# Patient Record
Sex: Male | Born: 1990 | Race: White | Hispanic: No | Marital: Single | Smoking: Never smoker
Health system: Southern US, Community
[De-identification: ages and names within clinical notes are randomized; demographics above are authoritative.]

## PROBLEM LIST (undated history)

## (undated) DIAGNOSIS — K5792 Diverticulitis of intestine, part unspecified, without perforation or abscess without bleeding: Secondary | ICD-10-CM

## (undated) HISTORY — PX: HERNIA REPAIR: SHX51

---

## 2014-10-17 ENCOUNTER — Emergency Department (HOSPITAL_BASED_OUTPATIENT_CLINIC_OR_DEPARTMENT_OTHER): Payer: Self-pay

## 2014-10-17 ENCOUNTER — Emergency Department (HOSPITAL_BASED_OUTPATIENT_CLINIC_OR_DEPARTMENT_OTHER)
Admission: EM | Admit: 2014-10-17 | Discharge: 2014-10-17 | Disposition: A | Discharge status: Home / Self Care | Payer: Self-pay | Attending: Emergency Medicine | Admitting: Emergency Medicine

## 2014-10-17 ENCOUNTER — Encounter (HOSPITAL_BASED_OUTPATIENT_CLINIC_OR_DEPARTMENT_OTHER): Payer: Self-pay | Admitting: *Deleted

## 2014-10-17 DIAGNOSIS — K5732 Diverticulitis of large intestine without perforation or abscess without bleeding: Secondary | ICD-10-CM | POA: Insufficient documentation

## 2014-10-17 LAB — URINALYSIS, ROUTINE W REFLEX MICROSCOPIC
Bilirubin Urine: NEGATIVE
GLUCOSE, UA: NEGATIVE mg/dL
HGB URINE DIPSTICK: NEGATIVE
KETONES UR: NEGATIVE mg/dL
LEUKOCYTES UA: NEGATIVE
Nitrite: NEGATIVE
PH: 5.5 (ref 5.0–8.0)
Protein, ur: NEGATIVE mg/dL
Specific Gravity, Urine: >1.046 — ABNORMAL HIGH (ref 1.005–1.030)
Urobilinogen, UA: 0.2 mg/dL (ref 0.0–1.0)

## 2014-10-17 LAB — COMPREHENSIVE METABOLIC PANEL
ALT: 68 U/L — AB (ref 0–53)
AST: 41 U/L — AB (ref 0–37)
Albumin: 4.2 g/dL (ref 3.5–5.2)
Alkaline Phosphatase: 57 U/L (ref 39–117)
Anion gap: 2 — ABNORMAL LOW (ref 5–15)
BILIRUBIN TOTAL: 0.4 mg/dL (ref 0.3–1.2)
BUN: 12 mg/dL (ref 6–23)
CHLORIDE: 108 mmol/L (ref 96–112)
CO2: 27 mmol/L (ref 19–32)
Calcium: 9 mg/dL (ref 8.4–10.5)
Creatinine, Ser: 0.95 mg/dL (ref 0.50–1.35)
GFR calc Af Amer: >90 mL/min (ref >90)
GFR calc non Af Amer: >90 mL/min (ref >90)
Glucose, Bld: 91 mg/dL (ref 70–99)
Potassium: 4 mmol/L (ref 3.5–5.1)
SODIUM: 137 mmol/L (ref 135–145)
Total Protein: 7.7 g/dL (ref 6.0–8.3)

## 2014-10-17 LAB — CBC WITH DIFFERENTIAL/PLATELET
BASOS ABS: 0 10*3/uL (ref 0.0–0.1)
BASOS PCT: 0 % (ref 0–1)
EOS PCT: 1 % (ref 0–5)
Eosinophils Absolute: 0.1 10*3/uL (ref 0.0–0.7)
HCT: 44.1 % (ref 39.0–52.0)
HEMOGLOBIN: 14.7 g/dL (ref 13.0–17.0)
LYMPHS PCT: 45 % (ref 12–46)
Lymphs Abs: 3.7 10*3/uL (ref 0.7–4.0)
MCH: 28.6 pg (ref 26.0–34.0)
MCHC: 33.3 g/dL (ref 30.0–36.0)
MCV: 85.8 fL (ref 78.0–100.0)
Monocytes Absolute: 1 10*3/uL (ref 0.1–1.0)
Monocytes Relative: 12 % (ref 3–12)
Neutro Abs: 3.5 10*3/uL (ref 1.7–7.7)
Neutrophils Relative %: 42 % — ABNORMAL LOW (ref 43–77)
PLATELETS: 233 10*3/uL (ref 150–400)
RBC: 5.14 MIL/uL (ref 4.22–5.81)
RDW: 13.3 % (ref 11.5–15.5)
WBC: 8.2 10*3/uL (ref 4.0–10.5)

## 2014-10-17 LAB — LIPASE, BLOOD: Lipase: 24 U/L (ref 11–59)

## 2014-10-17 MED ORDER — METRONIDAZOLE 500 MG PO TABS: 500.0000 mg | ORAL_TABLET | Freq: Two times a day (BID) | ORAL | Status: DC

## 2014-10-17 MED ORDER — IOHEXOL 300 MG/ML  SOLN
100.0000 mL | Freq: Once | INTRAMUSCULAR | Status: AC | PRN
  Administered 2014-10-17: 100 mL via INTRAVENOUS

## 2014-10-17 MED ORDER — CIPROFLOXACIN HCL 500 MG PO TABS
500.0000 mg | ORAL_TABLET | Freq: Once | ORAL | Status: AC
  Administered 2014-10-17: 500 mg via ORAL
  Filled 2014-10-17: qty 1

## 2014-10-17 MED ORDER — METRONIDAZOLE 500 MG PO TABS
500.0000 mg | ORAL_TABLET | Freq: Once | ORAL | Status: AC
  Administered 2014-10-17: 500 mg via ORAL
  Filled 2014-10-17: qty 1

## 2014-10-17 MED ORDER — CIPROFLOXACIN HCL 500 MG PO TABS: 500.0000 mg | ORAL_TABLET | Freq: Two times a day (BID) | ORAL | Status: DC

## 2014-10-17 MED ORDER — HYDROCODONE-ACETAMINOPHEN 5-325 MG PO TABS: 1.0000 | ORAL_TABLET | Freq: Four times a day (QID) | ORAL | Status: DC | PRN

## 2014-10-17 MED ORDER — IOHEXOL 300 MG/ML  SOLN
25.0000 mL | Freq: Once | INTRAMUSCULAR | Status: AC | PRN
  Administered 2014-10-17: 25 mL via ORAL

## 2014-10-17 NOTE — Discharge Instructions (Signed)
Diverticulitis °Diverticulitis is inflammation or infection of small pouches in your colon that form when you have a condition called diverticulosis. The pouches in your colon are called diverticula. Your colon, or large intestine, is where water is absorbed and stool is formed. °Complications of diverticulitis can include: °· Bleeding. °· Severe infection. °· Severe pain. °· Perforation of your colon. °· Obstruction of your colon. °CAUSES  °Diverticulitis is caused by bacteria. °Diverticulitis happens when stool becomes trapped in diverticula. This allows bacteria to grow in the diverticula, which can lead to inflammation and infection. °RISK FACTORS °People with diverticulosis are at risk for diverticulitis. Eating a diet that does not include enough fiber from fruits and vegetables may make diverticulitis more likely to develop. °SYMPTOMS  °Symptoms of diverticulitis may include: °· Abdominal pain and tenderness. The pain is normally located on the left side of the abdomen, but may occur in other areas. °· Fever and chills. °· Bloating. °· Cramping. °· Nausea. °· Vomiting. °· Constipation. °· Diarrhea. °· Blood in your stool. °DIAGNOSIS  °Your health care provider will ask you about your medical history and do a physical exam. You may need to have tests done because many medical conditions can cause the same symptoms as diverticulitis. Tests may include: °· Blood tests. °· Urine tests. °· Imaging tests of the abdomen, including X-rays and CT scans. °When your condition is under control, your health care provider may recommend that you have a colonoscopy. A colonoscopy can show how severe your diverticula are and whether something else is causing your symptoms. °TREATMENT  °Most cases of diverticulitis are mild and can be treated at home. Treatment may include: °· Taking over-the-counter pain medicines. °· Following a clear liquid diet. °· Taking antibiotic medicines by mouth for 7-10 days. °More severe cases may  be treated at a hospital. Treatment may include: °· Not eating or drinking. °· Taking prescription pain medicine. °· Receiving antibiotic medicines through an IV tube. °· Receiving fluids and nutrition through an IV tube. °· Surgery. °HOME CARE INSTRUCTIONS  °· Follow your health care provider's instructions carefully. °· Follow a full liquid diet or other diet as directed by your health care provider. After your symptoms improve, your health care provider may tell you to change your diet. He or she may recommend you eat a high-fiber diet. Fruits and vegetables are good sources of fiber. Fiber makes it easier to pass stool. °· Take fiber supplements or probiotics as directed by your health care provider. °· Only take medicines as directed by your health care provider. °· Keep all your follow-up appointments. °SEEK MEDICAL CARE IF:  °· Your pain does not improve. °· You have a hard time eating food. °· Your bowel movements do not return to normal. °SEEK IMMEDIATE MEDICAL CARE IF:  °· Your pain becomes worse. °· Your symptoms do not get better. °· Your symptoms suddenly get worse. °· You have a fever. °· You have repeated vomiting. °· You have bloody or black, tarry stools. °MAKE SURE YOU:  °· Understand these instructions. °· Will watch your condition. °· Will get help right away if you are not doing well or get worse. °Document Released: 05/13/2005 Document Revised: 08/08/2013 Document Reviewed: 06/28/2013 °[employer]® Patient Information ©2015 [employer]. This information is not intended to replace advice given to you by your health care provider. Make sure you discuss any questions you have with your health care provider. ° ° °High-Fiber Diet °Fiber is found in fruits, vegetables, and grains. A high-fiber   diet encourages the addition of more whole grains, legumes, fruits, and vegetables in your diet. The recommended amount of fiber for adult males is 38 g per day. For adult females, it is 25 g per day. Pregnant  and lactating women should get 28 g of fiber per day. If you have a digestive or bowel problem, ask your caregiver for advice before adding high-fiber foods to your diet. Eat a variety of high-fiber foods instead of only a select few type of foods.  °PURPOSE °· To increase stool bulk. °· To make bowel movements more regular to prevent constipation. °· To lower cholesterol. °· To prevent overeating. °WHEN IS THIS DIET USED? °· It may be used if you have constipation and hemorrhoids. °· It may be used if you have uncomplicated diverticulosis (intestine condition) and irritable bowel syndrome. °· It may be used if you need help with weight management. °· It may be used if you want to add it to your diet as a protective measure against atherosclerosis, diabetes, and cancer. °SOURCES OF FIBER °· Whole-grain breads and cereals. °· Fruits, such as apples, oranges, bananas, berries, prunes, and pears. °· Vegetables, such as green peas, carrots, sweet potatoes, beets, broccoli, cabbage, spinach, and artichokes. °· Legumes, such split peas, soy, lentils. °· Almonds. °FIBER CONTENT IN FOODS °Starches and Grains / Dietary Fiber (g) °· Cheerios, 1 cup / 3 g °· Corn Flakes cereal, 1 cup / 0.7 g °· Rice crispy treat cereal, 1¼ cup / 0.3 g °· Instant oatmeal (cooked), ½ cup / 2 g °· Frosted wheat cereal, 1 cup / 5.1 g °· Brown, long-grain rice (cooked), 1 cup / 3.5 g °· White, long-grain rice (cooked), 1 cup / 0.6 g °· Enriched macaroni (cooked), 1 cup / 2.5 g °Legumes / Dietary Fiber (g) °· Baked beans (canned, plain, or vegetarian), ½ cup / 5.2 g °· Kidney beans (canned), ½ cup / 6.8 g °· Pinto beans (cooked), ½ cup / 5.5 g °Breads and Crackers / Dietary Fiber (g) °· Plain or honey graham crackers, 2 squares / 0.7 g °· Saltine crackers, 3 squares / 0.3 g °· Plain, salted pretzels, 10 pieces / 1.8 g °· Whole-wheat bread, 1 slice / 1.9 g °· White bread, 1 slice / 0.7 g °· Raisin bread, 1 slice / 1.2 g °· Plain bagel, 3 oz / 2  g °· Flour tortilla, 1 oz / 0.9 g °· Corn tortilla, 1 small / 1.5 g °· Hamburger or hotdog bun, 1 small / 0.9 g °Fruits / Dietary Fiber (g) °· Apple with skin, 1 medium / 4.4 g °· Sweetened applesauce, ½ cup / 1.5 g °· Banana, ½ medium / 1.5 g °· Grapes, 10 grapes / 0.4 g °· Orange, 1 small / 2.3 g °· Raisin, 1.5 oz / 1.6 g °· Melon, 1 cup / 1.4 g °Vegetables / Dietary Fiber (g) °· Green beans (canned), ½ cup / 1.3 g °· Carrots (cooked), ½ cup / 2.3 g °· Broccoli (cooked), ½ cup / 2.8 g °· Peas (cooked), ½ cup / 4.4 g °· Mashed potatoes, ½ cup / 1.6 g °· Lettuce, 1 cup / 0.5 g °· Corn (canned), ½ cup / 1.6 g °· Tomato, ½ cup / 1.1 g °Document Released: 08/03/2005 Document Revised: 02/02/2012 Document Reviewed: 11/05/2011 °Exit[employer]® Patient Information ©2015 [employer]. This information is not intended to replace advice given to you by your health care provider. Make sure you discuss any questions you have with your health   care provider. ° °

## 2014-10-17 NOTE — ED Provider Notes (Signed)
CSN: [id number]     Arrival date & time 10/17/14  1641 History   First MD Initiated Contact with Patient 10/17/14 1651     Chief Complaint  Patient presents with  . Abdominal Pain     (Consider location/radiation/quality/duration/timing/severity/associated sxs/prior Treatment) HPI  24 year old male presents with left lower quadrant abdominal pain for the past 2 days. It is a sharp pain that is constant. Nothing particularly makes it better or worse including food and bowel movements. He thought he was constipated and took some miralax but that did not change anything. No dysuria or hematuria. No groin swelling or testicular pain or swelling. No penile discharge. Has occasionally felt nauseated but no vomiting. No prior history of similar symptoms. Rates his pain as a 7/10. Has not taken anything for the pain.  History reviewed. No pertinent past medical history. Past Surgical History  Procedure Laterality Date  . Hernia repair     No family history on file. History  Substance Use Topics  . Smoking status: Never Smoker   . Smokeless tobacco: Not on file  . Alcohol Use: No    Review of Systems  Constitutional: Negative for fever.  Gastrointestinal: Positive for abdominal pain. Negative for vomiting, diarrhea and constipation.  Genitourinary: Negative for dysuria, hematuria, discharge, scrotal swelling, penile pain and testicular pain.       Allergies  Review of patient's allergies indicates not on file.  Home Medications   Prior to Admission medications   Not on File   BP 154/77 mmHg  Pulse 88  Temp(Src) 98.4 F (36.9 C) (Oral)  Resp 18  Ht 6' (1.829 m)  Wt 235 lb (106.595 kg)  BMI 31.86 kg/m2  SpO2 100% Physical Exam  Constitutional: He is oriented to person, place, and time. He appears well-developed and well-nourished.  HENT:  Head: Normocephalic and atraumatic.  Right Ear: External ear normal.  Left Ear: External ear normal.  Nose: Nose normal.  Eyes: Right  eye exhibits no discharge. Left eye exhibits no discharge.  Neck: Neck supple.  Cardiovascular: Normal rate, regular rhythm, normal heart sounds and intact distal pulses.   Pulmonary/Chest: Effort normal.  Abdominal: Soft. There is tenderness in the right lower quadrant, suprapubic area and left lower quadrant. Hernia confirmed negative in the right inguinal area and confirmed negative in the left inguinal area.  Genitourinary: Penis normal.  Musculoskeletal: He exhibits no edema.  Neurological: He is alert and oriented to person, place, and time.  Skin: Skin is warm and dry.  Nursing note and vitals reviewed.   ED Course  Procedures (including critical care time) Labs Review Labs Reviewed  COMPREHENSIVE METABOLIC PANEL - Abnormal; Notable for the following:    AST 41 (*)    ALT 68 (*)    Anion gap 2 (*)    All other components within normal limits  CBC WITH DIFFERENTIAL/PLATELET - Abnormal; Notable for the following:    Neutrophils Relative % 42 (*)    All other components within normal limits  URINALYSIS, ROUTINE W REFLEX MICROSCOPIC - Abnormal; Notable for the following:    Specific Gravity, Urine >1.046 (*)    All other components within normal limits  LIPASE, BLOOD    Imaging Review Ct Abdomen Pelvis W Contrast  10/17/2014   CLINICAL DATA:  Left lower quadrant abdominal pain and nausea.  EXAM: CT ABDOMEN AND PELVIS WITH CONTRAST  TECHNIQUE: Multidetector CT imaging of the abdomen and pelvis was performed using the standard protocol following bolus administration of intravenous  contrast.  CONTRAST:  25mL OMNIPAQUE IOHEXOL 300 MG/ML SOLN, OMNIPAQUE IOHEXOL 300 MG/ML SOLN  COMPARISON:  None.  FINDINGS: Visualized lung bases are normal. The liver shows diffuse steatosis without evidence of overt cirrhosis. No hepatic masses or biliary dilatation identified.  The gallbladder, pancreas, spleen, adrenal glands and kidneys are within normal limits.  There is evidence of acute  diverticulitis involving the mid sigmoid colon with wall thickening and surrounding pericolonic inflammatory changes present. Overall inflammatory changes are relatively mild. There is no evidence of extraluminal air or focal abscess.  No evidence of bowel obstruction. No masses or enlarged lymph nodes are seen. No hernias are identified. The bladder is unremarkable. No abnormal calcifications or vascular abnormalities. Bony structures are within normal limits.  IMPRESSION: 1. Acute diverticulitis of the sigmoid colon. Inflammatory changes are relatively mild and there is no evidence of extraluminal air or focal abscess. 2. Diffuse steatosis of the liver.   Electronically Signed   By: Irish Lack M.D.   On: 10/17/2014 18:44     EKG Interpretation None      MDM   Final diagnoses:  Diverticulitis of large intestine without perforation or abscess without bleeding    Patient CT scan shows uncomplicated acute diverticulitis. No abscess or perforation. Patient's pain is moderate and he declines pain meds currently. Given his young and otherwise healthy he will be treated outpatient with Cipro and Flagyl. We'll give narcotics for pain control as needed. Discussed importance of follow-up and establishment of a PCP as well as strict return precautions.    Audree Camel, MD 10/17/14 [phone]

## 2014-10-17 NOTE — ED Notes (Signed)
Pt reports sharp lower left abdominal pain and nausea for two days.

## 2014-10-17 NOTE — ED Notes (Signed)
Pt comes in today with a c/o left lower quad/inguinal area pain that has been ongoing for the past 2 days. Pt denies any trauma.  Pt states he has been having nausea. Pt states "it feels like I'm going to throw up but I have swallowed it." Pt denies any vomiting or diarrhea.

## 2016-02-17 ENCOUNTER — Emergency Department (HOSPITAL_BASED_OUTPATIENT_CLINIC_OR_DEPARTMENT_OTHER): Payer: Self-pay

## 2016-02-17 ENCOUNTER — Emergency Department (HOSPITAL_BASED_OUTPATIENT_CLINIC_OR_DEPARTMENT_OTHER)
Admission: EM | Admit: 2016-02-17 | Discharge: 2016-02-17 | Disposition: A | Discharge status: Home / Self Care | Payer: Self-pay | Attending: Emergency Medicine | Admitting: Emergency Medicine

## 2016-02-17 ENCOUNTER — Encounter (HOSPITAL_BASED_OUTPATIENT_CLINIC_OR_DEPARTMENT_OTHER): Payer: Self-pay | Admitting: *Deleted

## 2016-02-17 DIAGNOSIS — K5732 Diverticulitis of large intestine without perforation or abscess without bleeding: Secondary | ICD-10-CM | POA: Insufficient documentation

## 2016-02-17 HISTORY — DX: Diverticulitis of intestine, part unspecified, without perforation or abscess without bleeding: K57.92

## 2016-02-17 LAB — COMPREHENSIVE METABOLIC PANEL
ALBUMIN: 4.3 g/dL (ref 3.5–5.0)
ALK PHOS: 60 U/L (ref 38–126)
ALT: 32 U/L (ref 17–63)
AST: 27 U/L (ref 15–41)
Anion gap: 8 (ref 5–15)
BUN: 12 mg/dL (ref 6–20)
CALCIUM: 9.3 mg/dL (ref 8.9–10.3)
CO2: 28 mmol/L (ref 22–32)
CREATININE: 0.94 mg/dL (ref 0.61–1.24)
Chloride: 105 mmol/L (ref 101–111)
GFR calc non Af Amer: >60 mL/min (ref >60)
Glucose, Bld: 89 mg/dL (ref 65–99)
Potassium: 4 mmol/L (ref 3.5–5.1)
Sodium: 141 mmol/L (ref 135–145)
Total Bilirubin: 0.5 mg/dL (ref 0.3–1.2)
Total Protein: 8 g/dL (ref 6.5–8.1)

## 2016-02-17 LAB — CBC WITH DIFFERENTIAL/PLATELET
Basophils Absolute: 0 10*3/uL (ref 0.0–0.1)
Basophils Relative: 0 %
EOS ABS: 0.1 10*3/uL (ref 0.0–0.7)
Eosinophils Relative: 1 %
HCT: 45.9 % (ref 39.0–52.0)
HEMOGLOBIN: 15.6 g/dL (ref 13.0–17.0)
Lymphocytes Relative: 39 %
Lymphs Abs: 3.2 10*3/uL (ref 0.7–4.0)
MCH: 28.7 pg (ref 26.0–34.0)
MCHC: 34 g/dL (ref 30.0–36.0)
MCV: 84.4 fL (ref 78.0–100.0)
Monocytes Absolute: 0.9 10*3/uL (ref 0.1–1.0)
Monocytes Relative: 11 %
Neutro Abs: 4.1 10*3/uL (ref 1.7–7.7)
Neutrophils Relative %: 49 %
Platelets: 256 10*3/uL (ref 150–400)
RBC: 5.44 MIL/uL (ref 4.22–5.81)
RDW: 13.3 % (ref 11.5–15.5)
WBC: 8.3 10*3/uL (ref 4.0–10.5)

## 2016-02-17 LAB — URINALYSIS, ROUTINE W REFLEX MICROSCOPIC
BILIRUBIN URINE: NEGATIVE
Glucose, UA: NEGATIVE mg/dL
Hgb urine dipstick: NEGATIVE
Ketones, ur: NEGATIVE mg/dL
Leukocytes, UA: NEGATIVE
NITRITE: NEGATIVE
Protein, ur: NEGATIVE mg/dL
Specific Gravity, Urine: 1.018 (ref 1.005–1.030)
pH: 5.5 (ref 5.0–8.0)

## 2016-02-17 MED ORDER — METRONIDAZOLE 500 MG PO TABS: 500.0000 mg | ORAL_TABLET | Freq: Three times a day (TID) | ORAL | Status: AC

## 2016-02-17 MED ORDER — METRONIDAZOLE 500 MG PO TABS
500.0000 mg | ORAL_TABLET | Freq: Once | ORAL | Status: AC
  Administered 2016-02-17: 500 mg via ORAL
  Filled 2016-02-17: qty 1

## 2016-02-17 MED ORDER — CIPROFLOXACIN HCL 500 MG PO TABS
500.0000 mg | ORAL_TABLET | Freq: Once | ORAL | Status: AC
  Administered 2016-02-17: 500 mg via ORAL
  Filled 2016-02-17: qty 1

## 2016-02-17 MED ORDER — HYDROCODONE-ACETAMINOPHEN 5-325 MG PO TABS: ORAL_TABLET | ORAL | Status: AC

## 2016-02-17 MED ORDER — CIPROFLOXACIN HCL 500 MG PO TABS: 500.0000 mg | ORAL_TABLET | Freq: Two times a day (BID) | ORAL | Status: AC

## 2016-02-17 MED ORDER — IOPAMIDOL (ISOVUE-300) INJECTION 61%
100.0000 mL | Freq: Once | INTRAVENOUS | Status: AC | PRN
  Administered 2016-02-17: 100 mL via INTRAVENOUS

## 2016-02-17 NOTE — ED Notes (Signed)
Patient transported to CT 

## 2016-02-17 NOTE — ED Notes (Signed)
Pain below his umbilicus for a week.  He thinks he is a diverticulitis flare up.

## 2016-02-17 NOTE — ED Provider Notes (Signed)
CSN: [id number]     Arrival date & time 02/17/16  1628 History  By signing my name below, I, Benjamin Glover, attest that this documentation has been prepared under the direction and in the presence of Renne CriglerJoshua Torres Hardenbrook, PA-C.   Electronically Signed: Rosario AdieWilliam Andrew Glover, ED Scribe. 02/17/2016. 6:34 PM.   Chief Complaint  Patient presents with  . Abdominal Pain   HPI HPI Comments: Benjamin Glover is a 25 y.o. male with a PMHx significant for sigmoid diverticulitis who presents to the Emergency Department complaining of gradual onset, gradually worsening, constant, 8/10 right-sided and umbilical abdominal pain onset four days PTA. Pt has a hx of abdominal pain with his previous dx of diverticulitis approximately 1 year ago, and notes that his pain today is not in the same area as his last flare up. He has taken Miralax, Advil, and left over antibiotics from his last flare up 1 year ago prior to coming into the ED with no relief of his pain. No aggravating factors. Pt reports that he has had a central abdominal hernia repair approximately 16 years PTA. Pt denies constipation, urinary problems, or fever.   Past Medical History  Diagnosis Date  . Diverticulitis    Past Surgical History  Procedure Laterality Date  . Hernia repair     No family history on file. Social History  Substance Use Topics  . Smoking status: Never Smoker   . Smokeless tobacco: None  . Alcohol Use: No    Review of Systems  Constitutional: Negative for fever.  HENT: Negative for rhinorrhea and sore throat.   Eyes: Negative for redness.  Respiratory: Negative for cough.   Cardiovascular: Negative for chest pain.  Gastrointestinal: Positive for abdominal pain. Negative for nausea, vomiting, diarrhea and constipation.  Genitourinary: Negative for dysuria, urgency, frequency, hematuria and difficulty urinating.  Musculoskeletal: Negative for myalgias.  Skin: Negative for rash.  Neurological: Negative for headaches.    Allergies  Review of patient's allergies indicates no known allergies.  Home Medications   Prior to Admission medications   Medication Sig Start Date End Date Taking? Authorizing Provider  ciprofloxacin (CIPRO) 500 MG tablet Take 1 tablet (500 mg total) by mouth 2 (two) times daily. One po bid x 7 days 10/17/14   Pricilla LovelessScott Goldston, MD  HYDROcodone-acetaminophen [facility](NORCO) 5-325 MG per tablet Take 1 tablet by mouth every 6 (six) hours as needed for severe pain. 10/17/14   Pricilla LovelessScott Goldston, MD  metroNIDAZOLE (FLAGYL) 500 MG tablet Take 1 tablet (500 mg total) by mouth 2 (two) times daily. One po bid x 7 days 10/17/14   Pricilla LovelessScott Goldston, MD   BP 151/89 mmHg  Pulse 103  Temp(Src) 98.7 F (37.1 C) (Oral)  Resp 18  Ht 6' (1.829 m)  Wt 275 lb (124.739 kg)  BMI 37.29 kg/m2  SpO2 100%   Physical Exam  Constitutional: He appears well-developed and well-nourished.  HENT:  Head: Normocephalic and atraumatic.  Eyes: Conjunctivae are normal. Right eye exhibits no discharge. Left eye exhibits no discharge.  Neck: Normal range of motion. Neck supple.  Cardiovascular: Normal rate, regular rhythm and normal heart sounds.   Pulmonary/Chest: Effort normal and breath sounds normal. No respiratory distress.  Abdominal: Soft. He exhibits no distension. There is tenderness in the right lower quadrant and suprapubic area. There is no rebound, no guarding and no CVA tenderness.    Musculoskeletal: Normal range of motion.  Neurological: He is alert.  Skin: Skin is warm and dry.  Psychiatric: He has  a normal mood and affect. His behavior is normal.  Nursing note and vitals reviewed.  ED Course  Procedures (including critical care time)  DIAGNOSTIC STUDIES: Oxygen Saturation is 100% on RA, normal by my interpretation.   COORDINATION OF CARE: 6:33 PM-Discussed next steps with pt including CT A/p w/ contrast, CBC, CMP, and UA. Pt verbalized understanding and is agreeable with the plan.   Labs Review Labs  Reviewed  URINALYSIS, ROUTINE W REFLEX MICROSCOPIC (NOT AT [facility])  CBC WITH DIFFERENTIAL/PLATELET  COMPREHENSIVE METABOLIC PANEL   Imaging Review Ct Abdomen Pelvis W Contrast  02/17/2016  CLINICAL DATA:  One week history of right lower quadrant pain EXAM: CT ABDOMEN AND PELVIS WITH CONTRAST TECHNIQUE: Multidetector CT imaging of the abdomen and pelvis was performed using the standard protocol following bolus administration of intravenous contrast. Oral contrast was also administered. CONTRAST:  ISOVUE-300 IOPAMIDOL (ISOVUE-300) INJECTION 61% COMPARISON:  October 17, 2014 FINDINGS: Lower chest: There is a small calcified granuloma in the superior segment of the right lower lobe. The lung bases otherwise are clear. Hepatobiliary: No focal liver lesions are evident. Gallbladder wall is not appreciably thickened. There is no biliary duct dilatation. Pancreas: There is no appreciable pancreatic mass or inflammatory focus. Spleen: No splenic lesions are evident. Small splenules are noted adjacent to the spleen. Adrenals/Urinary Tract: Adrenals appear normal bilaterally. Kidneys bilaterally show no mass or hydronephrosis on either side. There is no demonstrable renal or ureteral calculus on either side. Urinary bladder is midline with wall thickness within normal limits. Stomach/Bowel: There is mesenteric thickening in the region of the distal sigmoid colon with mild wall thickening in this area consistent with a degree of distal sigmoid diverticulitis. There is no abscess or perforation in this area. No other bowel wall or mesenteric thickening is evident on this study. There are scattered diverticula throughout the remainder the colon without inflammation. There is no bowel obstruction. No free air or portal venous air. Vascular/Lymphatic: There is no abdominal aortic aneurysm. Mesenteric vessels appear patent. There is no adenopathy in the abdomen or pelvis. Reproductive: Prostate and seminal vesicles appear  normal. There is no pelvic mass or pelvic fluid collection. Other: Appendix appears normal. There is no ascites or abscess in the abdomen or pelvis. There is a small ventral hernia containing only fat. Musculoskeletal: There are no blastic or lytic bone lesions. There is no intramuscular or abdominal wall lesion. IMPRESSION: Distal sigmoid diverticulitis without abscess or perforation. Bowel elsewhere appears unremarkable except for scattered colonic diverticula elsewhere without inflammation. No bowel obstruction. No abscess elsewhere. Appendix appears normal. No renal or ureteral calculus.  No hydronephrosis. Small ventral hernia containing only fat. Electronically Signed   By: Bretta Bang III M.D.   On: 02/17/2016 20:46   I have personally reviewed and evaluated these images and lab results as part of my medical decision-making.  9:14 PM patient updated on imaging results. Patient given Cipro/Flagyl in emergency department. Discharged home with treatment. Encouraged PCP follow-up in 5 days for recheck.  The patient was urged to return to the Emergency Department immediately with worsening of current symptoms, worsening abdominal pain, persistent vomiting, blood noted in stools, fever, or any other concerns. The patient verbalized understanding.   Patient counseled on use of narcotic pain medications. Counseled not to combine these medications with others containing tylenol. Urged not to drink alcohol, drive, or perform any other activities that requires focus while taking these medications. The patient verbalizes understanding and agrees with the plan.  MDM   Final diagnoses:  Sigmoid diverticulitis   Patient with right lower quadrant abdominal pain, CT scans demonstrates recurrent sigmoid diverticulitis without complication. Vitals are stable, no fever. Labs reassuring. No signs of dehydration, patient is tolerating PO's. Lungs are clear and no signs suggestive of PNA. Encouraged PCP/GI  follow-up as patient is fairly young for diverticulitis. May benefit from specialist management.   I personally performed the services described in this documentation, which was scribed in my presence. The recorded information has been reviewed and is accurate.     Renne CriglerJoshua Lakechia Nay, PA-C 02/17/16 2116  Gwyneth SproutWhitney Plunkett, MD 02/17/16 [phone]

## 2016-02-17 NOTE — Discharge Instructions (Signed)
Please read and follow all provided instructions.  Your diagnoses today include:  1. Sigmoid diverticulitis     Tests performed today include:  Blood counts and electrolytes  Blood tests to check liver and kidney function  Blood tests to check pancreas function  Urine test to look for infection and pregnancy (in women)  CT abdomen and pelvis - shows diverticulitis  Vital signs. See below for your results today.   Medications prescribed:   Ciprofloxacin - antibiotic  You have been prescribed an antibiotic medicine: take the entire course of medicine even if you are feeling better. Stopping early can cause the antibiotic not to work.   Metronidazole - antibiotic  You have been prescribed an antibiotic medicine: take the entire course of medicine even if you are feeling better. Stopping early can cause the antibiotic not to work. Do not drink alcohol when taking this medication.    Vicodin (hydrocodone/acetaminophen) - narcotic pain medication  DO NOT drive or perform any activities that require you to be awake and alert because this medicine can make you drowsy. BE VERY CAREFUL not to take multiple medicines containing Tylenol (also called acetaminophen). Doing so can lead to an overdose which can damage your liver and cause liver failure and possibly death.  Take any prescribed medications only as directed.  Home care instructions:   Follow any educational materials contained in this packet.  Follow-up instructions: Please follow-up with your primary care provider in the next 5 days for further evaluation of your symptoms.    Return instructions:  SEEK IMMEDIATE MEDICAL ATTENTION IF:  The pain does not go away or becomes severe   A temperature above 101F develops   Repeated vomiting occurs (multiple episodes)   The pain becomes localized to portions of the abdomen. The right side could possibly be appendicitis. In an adult, the left lower portion of the abdomen  could be colitis or diverticulitis.   Blood is being passed in stools or vomit (bright red or black tarry stools)   You develop chest pain, difficulty breathing, dizziness or fainting, or become confused, poorly responsive, or inconsolable (young children)  If you have any other emergent concerns regarding your health  Additional Information: Abdominal (belly) pain can be caused by many things. Your caregiver performed an examination and possibly ordered blood/urine tests and imaging (CT scan, x-rays, ultrasound). Many cases can be observed and treated at home after initial evaluation in the emergency department. Even though you are being discharged home, abdominal pain can be unpredictable. Therefore, you need a repeated exam if your pain does not resolve, returns, or worsens. Most patients with abdominal pain don't have to be admitted to the hospital or have surgery, but serious problems like appendicitis and gallbladder attacks can start out as nonspecific pain. Many abdominal conditions cannot be diagnosed in one visit, so follow-up evaluations are very important.  Your vital signs today were: BP 151/89 mmHg   Pulse 103   Temp(Src) 98.7 F (37.1 C) (Oral)   Resp 18   Ht 6' (1.829 m)   Wt 124.739 kg   BMI 37.29 kg/m2   SpO2 100% If your blood pressure (bp) was elevated above 135/85 this visit, please have this repeated by your doctor within one month. --------------

## 2020-11-25 ENCOUNTER — Emergency Department (HOSPITAL_COMMUNITY): Payer: No Typology Code available for payment source

## 2020-11-25 ENCOUNTER — Encounter (HOSPITAL_COMMUNITY): Payer: Self-pay | Admitting: Emergency Medicine

## 2020-11-25 ENCOUNTER — Other Ambulatory Visit: Payer: Self-pay

## 2020-11-25 ENCOUNTER — Emergency Department (HOSPITAL_COMMUNITY)
Admission: EM | Admit: 2020-11-25 | Discharge: 2020-11-25 | Disposition: A | Discharge status: Home / Self Care | Payer: No Typology Code available for payment source | Attending: Emergency Medicine | Admitting: Emergency Medicine

## 2020-11-25 DIAGNOSIS — R1032 Left lower quadrant pain: Secondary | ICD-10-CM | POA: Diagnosis not present

## 2020-11-25 DIAGNOSIS — R1031 Right lower quadrant pain: Secondary | ICD-10-CM | POA: Diagnosis not present

## 2020-11-25 LAB — URINALYSIS, ROUTINE W REFLEX MICROSCOPIC
Bilirubin Urine: NEGATIVE
Glucose, UA: NEGATIVE mg/dL
Hgb urine dipstick: NEGATIVE
Ketones, ur: NEGATIVE mg/dL
Leukocytes,Ua: NEGATIVE
Nitrite: NEGATIVE
Protein, ur: NEGATIVE mg/dL
Specific Gravity, Urine: 1.024 (ref 1.005–1.030)
pH: 7 (ref 5.0–8.0)

## 2020-11-25 LAB — CBC WITH DIFFERENTIAL/PLATELET
Abs Immature Granulocytes: 0.02 10*3/uL (ref 0.00–0.07)
Basophils Absolute: 0 10*3/uL (ref 0.0–0.1)
Basophils Relative: 1 %
Eosinophils Absolute: 0.1 10*3/uL (ref 0.0–0.5)
Eosinophils Relative: 1 %
HCT: 49.3 % (ref 39.0–52.0)
Hemoglobin: 16.2 g/dL (ref 13.0–17.0)
Immature Granulocytes: 0 %
Lymphocytes Relative: 40 %
Lymphs Abs: 2.1 10*3/uL (ref 0.7–4.0)
MCH: 29 pg (ref 26.0–34.0)
MCHC: 32.9 g/dL (ref 30.0–36.0)
MCV: 88.4 fL (ref 80.0–100.0)
Monocytes Absolute: 0.6 10*3/uL (ref 0.1–1.0)
Monocytes Relative: 11 %
Neutro Abs: 2.4 10*3/uL (ref 1.7–7.7)
Neutrophils Relative %: 47 %
Platelets: 230 10*3/uL (ref 150–400)
RBC: 5.58 MIL/uL (ref 4.22–5.81)
RDW: 12.3 % (ref 11.5–15.5)
WBC: 5.2 10*3/uL (ref 4.0–10.5)
nRBC: 0 % (ref 0.0–0.2)

## 2020-11-25 LAB — COMPREHENSIVE METABOLIC PANEL
ALT: 101 U/L — ABNORMAL HIGH (ref 0–44)
AST: 67 U/L — ABNORMAL HIGH (ref 15–41)
Albumin: 4.2 g/dL (ref 3.5–5.0)
Alkaline Phosphatase: 51 U/L (ref 38–126)
Anion gap: 10 (ref 5–15)
BUN: 10 mg/dL (ref 6–20)
CO2: 23 mmol/L (ref 22–32)
Calcium: 9 mg/dL (ref 8.9–10.3)
Chloride: 105 mmol/L (ref 98–111)
Creatinine, Ser: 0.8 mg/dL (ref 0.61–1.24)
GFR, Estimated: >60 mL/min (ref >60)
Glucose, Bld: 82 mg/dL (ref 70–99)
Potassium: 4 mmol/L (ref 3.5–5.1)
Sodium: 138 mmol/L (ref 135–145)
Total Bilirubin: 0.7 mg/dL (ref 0.3–1.2)
Total Protein: 7.7 g/dL (ref 6.5–8.1)

## 2020-11-25 LAB — LIPASE, BLOOD: Lipase: 26 U/L (ref 11–51)

## 2020-11-25 MED ORDER — IOHEXOL 300 MG/ML  SOLN
100.0000 mL | Freq: Once | INTRAMUSCULAR | Status: AC | PRN
  Administered 2020-11-25: 100 mL via INTRAVENOUS

## 2020-11-25 MED ORDER — PANTOPRAZOLE SODIUM 40 MG PO TBEC
40.0000 mg | DELAYED_RELEASE_TABLET | Freq: Every day | ORAL | 0 refills | Status: AC
Start: 2020-11-25 — End: ?

## 2020-11-25 NOTE — ED Provider Notes (Signed)
[facility] EMERGENCY DEPARTMENT Provider Note   CSN: [id number] Arrival date & time: 11/25/20  0865     History Chief Complaint  Patient presents with  . Abdominal Pain    Benjamin Glover is a 30 y.o. male.  HPI      Benjamin Glover is a 30 y.o. male who presents to the Emergency Department complaining of lower abdominal pain x3 weeks.  Pain began to the left lower quadrant of the abdomen.  Patient stated pain initially felt consistent with prior episodes of diverticulitis.  He was seen last week by a telehealth visit and prescribed metronidazole.  He has been taking the medication twice daily.  Since starting the antibiotic, pain has now moved to the right lower quadrant to radiates to right flank area.  Pain worsens with excessive sitting or walking.  Pain improves at rest and when lying supine.  Pain is not associated with food intake.  He denies nausea, vomiting, diarrhea or bowel changes.  No fever or chills, no chest pain or shortness of breath.  No difficulty urinating or hematuria.  Pain has been temporarily relieved with taking ibuprofen.    Past Medical History:  Diagnosis Date  . Diverticulitis     There are no problems to display for this patient.   Past Surgical History:  Procedure Laterality Date  . HERNIA REPAIR         History reviewed. No pertinent family history.  Social History   Tobacco Use  . Smoking status: Never Smoker  . Smokeless tobacco: Never Used  Substance Use Topics  . Alcohol use: No  . Drug use: No    Home Medications Prior to Admission medications   Medication Sig Start Date End Date Taking? Authorizing Provider  ciprofloxacin (CIPRO) 500 MG tablet Take 1 tablet (500 mg total) by mouth 2 (two) times daily. 02/17/16   Renne Crigler, PA-C  HYDROcodone-acetaminophen (NORCO/VICODIN) 5-325 MG tablet Take 1-2 tablets every 6 hours as needed for severe pain 02/17/16   Renne Crigler, PA-C  metroNIDAZOLE (FLAGYL) 500 MG tablet Take 1 tablet  (500 mg total) by mouth 3 (three) times daily. 02/17/16   Renne Crigler, PA-C    Allergies    Patient has no known allergies.  Review of Systems   Review of Systems  Constitutional: Negative for appetite change, chills and fever.  Respiratory: Negative for shortness of breath.   Cardiovascular: Negative for chest pain.  Gastrointestinal: Positive for abdominal pain. Negative for blood in stool, diarrhea, nausea and vomiting.  Genitourinary: Positive for flank pain. Negative for decreased urine volume, difficulty urinating and dysuria.  Musculoskeletal: Negative for back pain.  Skin: Negative for color change and rash.  Neurological: Negative for dizziness, weakness, numbness and headaches.  Hematological: Negative for adenopathy.    Physical Exam Updated Vital Signs BP (!) 156/96   Pulse 93   Temp 98.6 F (37 C) (Oral)   Resp 18   Ht 6' (1.829 m)   Wt 124.7 kg   SpO2 97%   BMI 37.30 kg/m   Physical Exam Vitals and nursing note reviewed.  Constitutional:      Appearance: Normal appearance. He is not ill-appearing or toxic-appearing.  HENT:     Head: Normocephalic.  Eyes:     Pupils: Pupils are equal, round, and reactive to light.  Neck:     Thyroid: No thyromegaly.     Meningeal: Kernig's sign absent.  Cardiovascular:     Rate and Rhythm: Normal rate and regular rhythm.  Pulses: Normal pulses.  Pulmonary:     Effort: Pulmonary effort is normal.     Breath sounds: Normal breath sounds. No wheezing.  Abdominal:     General: There is no distension.     Palpations: Abdomen is soft.     Tenderness: There is abdominal tenderness. There is no right CVA tenderness, left CVA tenderness, guarding or rebound.     Comments: Diffuse tenderness of the lower abdomen, right lower quadrant greater than left.  Abdomen is soft.  No guarding or rebound tenderness.  No CVA tenderness.  Musculoskeletal:        General: Normal range of motion.     Cervical back: Normal range of  motion and neck supple.     Right lower leg: No edema.     Left lower leg: No edema.  Skin:    General: Skin is warm.     Capillary Refill: Capillary refill takes less than 2 seconds.     Findings: No rash.  Neurological:     General: No focal deficit present.     Mental Status: He is alert.     Sensory: No sensory deficit.     Motor: No weakness.     ED Results / Procedures / Treatments   Labs (all labs ordered are listed, but only abnormal results are displayed) Labs Reviewed  COMPREHENSIVE METABOLIC PANEL - Abnormal; Notable for the following components:      Result Value   AST 67 (*)    ALT 101 (*)    All other components within normal limits  URINALYSIS, ROUTINE W REFLEX MICROSCOPIC - Abnormal; Notable for the following components:   Color, Urine STRAW (*)    All other components within normal limits  CBC WITH DIFFERENTIAL/PLATELET  LIPASE, BLOOD    EKG None  Radiology CT Abdomen Pelvis W Contrast  Result Date: 11/25/2020 CLINICAL DATA:  Right-sided abdominal pain EXAM: CT ABDOMEN AND PELVIS WITH CONTRAST TECHNIQUE: Multidetector CT imaging of the abdomen and pelvis was performed using the standard protocol following bolus administration of intravenous contrast. CONTRAST:  OMNIPAQUE IOHEXOL 300 MG/ML  SOLN COMPARISON:  02/17/2016 FINDINGS: Lower chest: A few scattered calcified granulomas are noted stable from the previous exam. Hepatobiliary: Fatty infiltration of the liver is seen. The gallbladder is within normal limits. Pancreas: Unremarkable. No pancreatic ductal dilatation or surrounding inflammatory changes. Spleen: Normal in size without focal abnormality. Adrenals/Urinary Tract: Adrenal glands are within normal limits. Kidneys demonstrate a normal enhancement pattern. No renal calculi or obstructive changes are seen. The ureters are within normal limits bilaterally. The bladder is incompletely distended. Stomach/Bowel: The appendix is within normal limits. No  obstructive or inflammatory changes of the colon are seen. The stomach and small bowel appear within normal limits. Vascular/Lymphatic: No significant vascular findings are present. No enlarged abdominal or pelvic lymph nodes. Reproductive: Prostate is unremarkable. Other: No abdominal wall hernia or abnormality. No abdominopelvic ascites. Musculoskeletal: No acute or significant osseous findings. IMPRESSION: Normal-appearing appendix. No renal calculi or obstructive changes. Fatty liver. Prior granulomatous disease. No acute abnormality is noted to correspond with the patient's given clinical history. Electronically Signed   By: Alcide Clever M.D.   On: 11/25/2020 14:28    Procedures Procedures   Medications Ordered in ED Medications - No data to display  ED Course  I have reviewed the triage vital signs and the nursing notes.  Pertinent labs & imaging results that were available during my care of the patient were reviewed by  me and considered in my medical decision making (see chart for details).    MDM Rules/Calculators/A&P                          Patient with history of diverticulitis here with lower abdominal pain x3 weeks.  Pain initially began in the left lower quadrant now moved to the right.  Overall he is well-appearing, nontoxic.  On exam, he has some mild tenderness of the right lower quadrant.  No guarding or rebound.  Will obtain labs and CT abdomen and pelvis for rule out of appendicitis.  On recheck, pt resting comfortably.  Labs interpreted by me, mildly elevated triaminases doubt relevanance to pt's current lower abdominal pain, remaining labs unremarkable. CT abd/pelvis w/o evidence of appendicitis or acute gall bladder findings.    Work up w/o evidence of acute abdominal process.  Source of pt's abdominal pain  is unclear.  Pt appears appropriate for d/c home, agreeable to close out pt f/u and return precautions given  The patient appears reasonably screened and/or  stabilized for discharge and I doubt any other medical condition or other [facility] requiring further screening, evaluation, or treatment in the ED at this time prior to discharge.    Final Clinical Impression(s) / ED Diagnoses Final diagnoses:  Right lower quadrant abdominal pain    Rx / DC Orders ED Discharge Orders    None       Pauline Aus, PA-C 11/26/20 2215    Rozelle Logan, DO 11/27/20 3220

## 2020-11-25 NOTE — Discharge Instructions (Signed)
The CT of your abdomen and pelvis today was reassuring.  Please continue to take your metronidazole twice daily as directed for a total of 10 days.  You may also take the pantoprazole as directed.  I have provided follow-up information for the local GI doctor.  You may call his office to arrange a follow-up appointment.  Return to the emergency department if you develop any worsening symptoms such as increasing pain, fever or persistent vomiting.

## 2020-11-25 NOTE — ED Triage Notes (Signed)
Pt c/o of RLQ abdominal pain x 3 weeks. Was prescribed ABX for 5 days for possible diverticulitis with no relief.

## 2020-11-25 NOTE — ED Notes (Signed)
Patient transported to CT 

## 2022-09-11 IMAGING — CT CT ABD-PELV W/ CM
2 of 4 series · 16 of 46 positions shown, 18 images · IV contrast (Omnipaque or Isovue)
Comparison: 02/17/2016

CLINICAL DATA: Right-sided abdominal pain

EXAM:
CT ABDOMEN AND PELVIS WITH CONTRAST
TECHNIQUE: Multidetector CT imaging of the abdomen and pelvis was performed
using the standard protocol following bolus administration of
intravenous contrast.
CONTRAST:  100mL OMNIPAQUE IOHEXOL 300 MG/ML  SOLN

[Series 2: axial st · axial · 0.86mm/px · z∈[+843,+1343]mm · 13 of 110 slices shown, 15 images]
[im 5/110  soft-tissue]
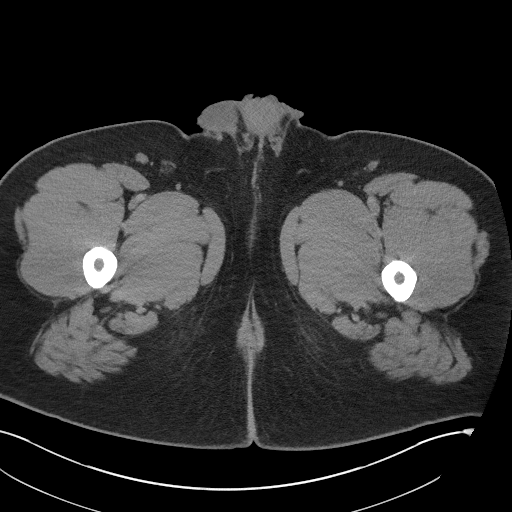
[im 5/110  bone]
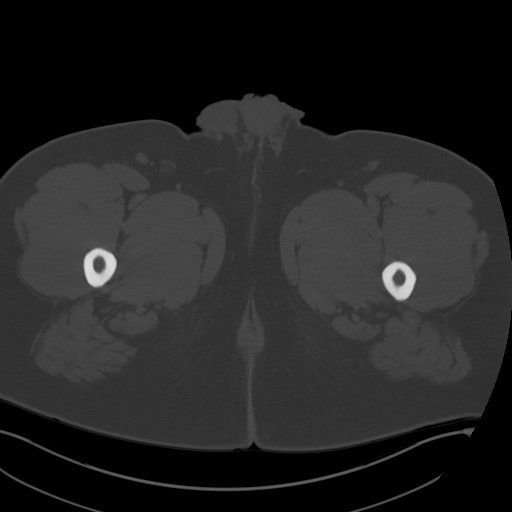
[im 14/110  soft-tissue]
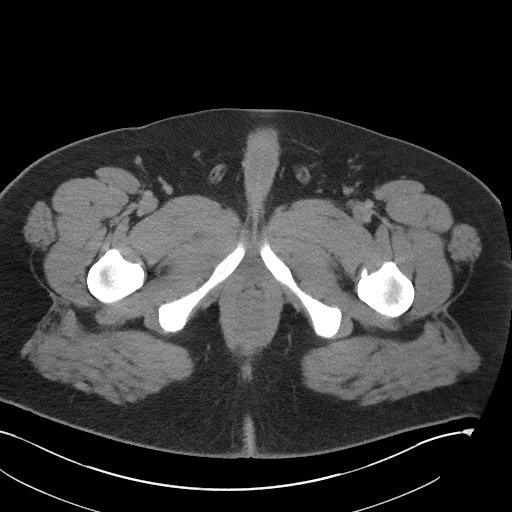
[im 22/110  soft-tissue]
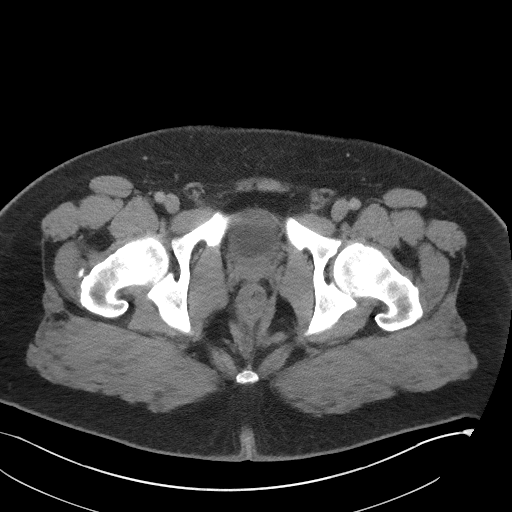
[im 31/110  soft-tissue]
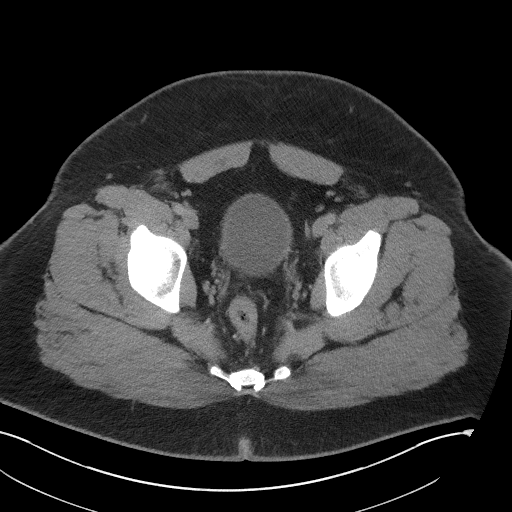
[im 40/110  soft-tissue]
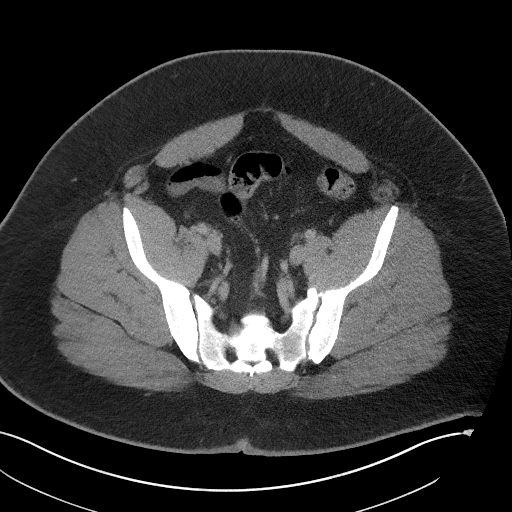
[im 48/110  soft-tissue]
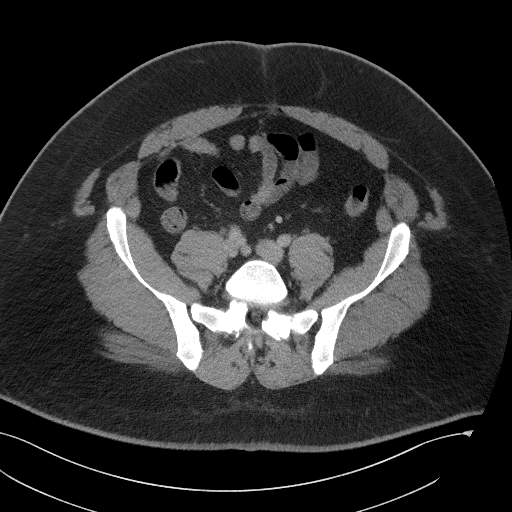
[im 57/110  soft-tissue]
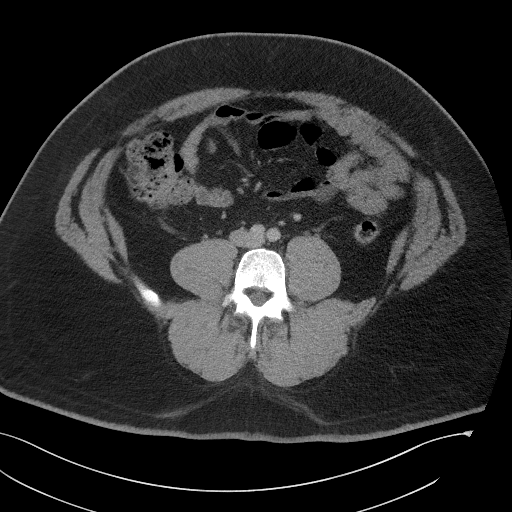
[im 62/110  soft-tissue]
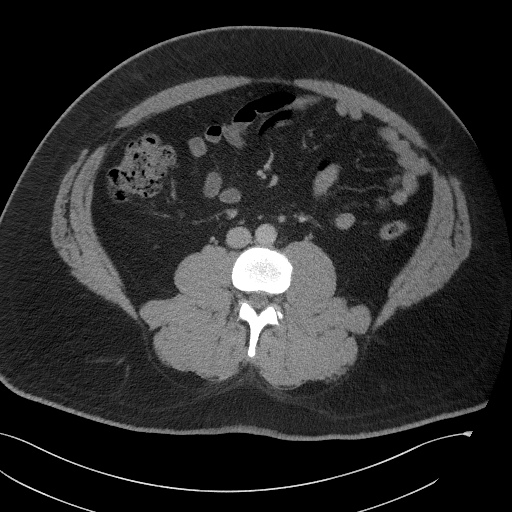
[im 70/110  soft-tissue]
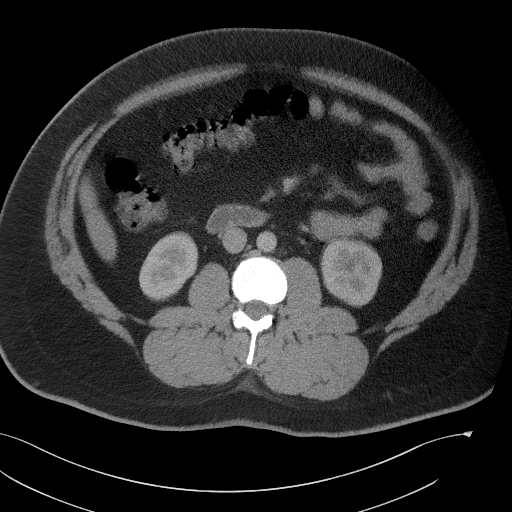
[im 70/110  bone]
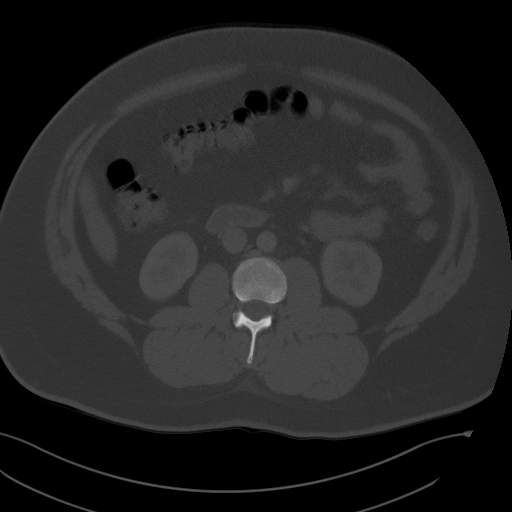
[im 79/110  soft-tissue]
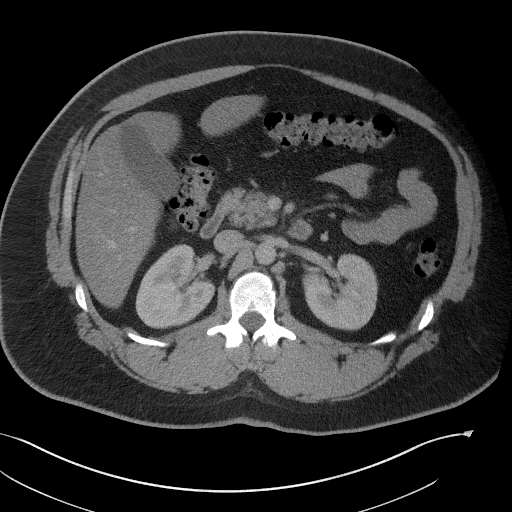
[im 88/110  soft-tissue]
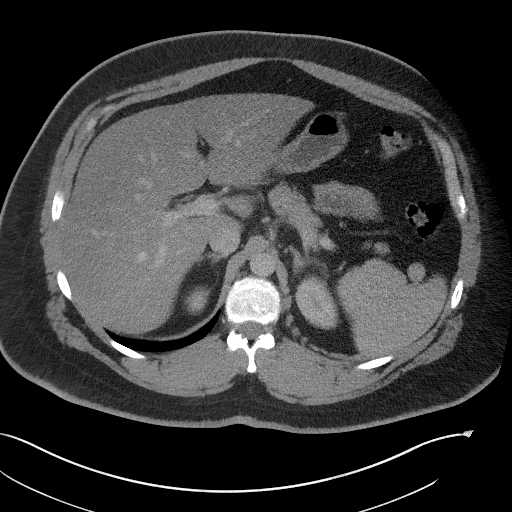
[im 96/110  soft-tissue]
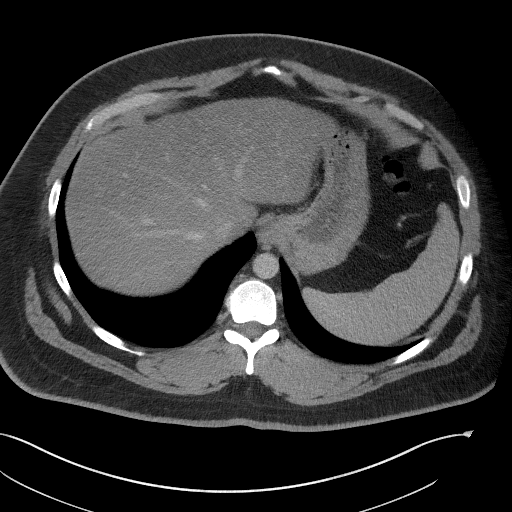
[im 105/110  soft-tissue]
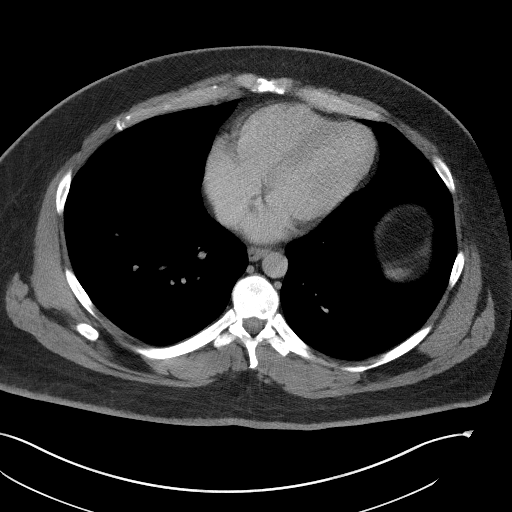

[Series 5: coronal st · coronal · 0.96mm/px · 3 of 136 slices shown]
[im 46/136  soft-tissue]
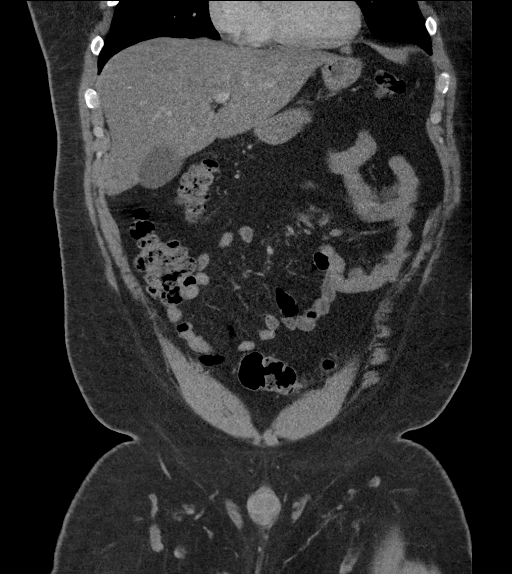
[im 61/136  soft-tissue]
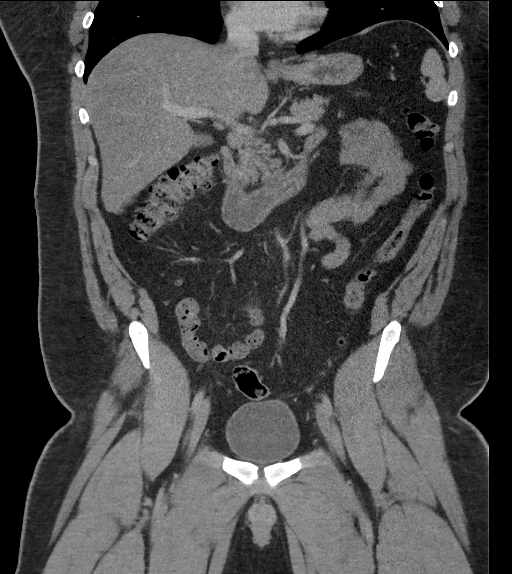
[im 76/136  soft-tissue]
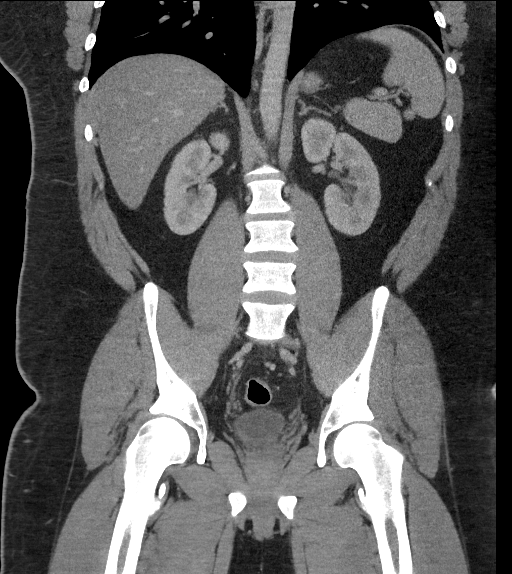

[16 of 46 positions shown; findings below may reference images not displayed]

FINDINGS: Lower chest: A few scattered calcified granulomas are noted stable
from the previous exam.

Hepatobiliary: Fatty infiltration of the liver is seen. The
gallbladder is within normal limits.

Pancreas: Unremarkable. No pancreatic ductal dilatation or
surrounding inflammatory changes.

Spleen: Normal in size without focal abnormality.

Adrenals/Urinary Tract: Adrenal glands are within normal limits.
Kidneys demonstrate a normal enhancement pattern. No renal calculi
or obstructive changes are seen. The ureters are within normal
limits bilaterally. The bladder is incompletely distended.

Stomach/Bowel: The appendix is within normal limits. No obstructive
or inflammatory changes of the colon are seen. The stomach and small
bowel appear within normal limits.

Vascular/Lymphatic: No significant vascular findings are present. No
enlarged abdominal or pelvic lymph nodes.

Reproductive: Prostate is unremarkable.

Other: No abdominal wall hernia or abnormality. No abdominopelvic
ascites.

Musculoskeletal: No acute or significant osseous findings.
IMPRESSION: Normal-appearing appendix.

No renal calculi or obstructive changes.

Fatty liver.

Prior granulomatous disease.

No acute abnormality is noted to correspond with the patient's given
clinical history.
